# Patient Record
Sex: Male | Born: 1960 | Race: White | Hispanic: No | Marital: Married | Smoking: Former smoker
Health system: Southern US, Community
[De-identification: ages and names within clinical notes are randomized; demographics above are authoritative.]

---

## 2015-08-12 ENCOUNTER — Emergency Department (HOSPITAL_COMMUNITY): Payer: Managed Care, Other (non HMO)

## 2015-08-12 ENCOUNTER — Emergency Department (HOSPITAL_COMMUNITY)
Admission: EM | Admit: 2015-08-12 | Discharge: 2015-08-12 | Disposition: A | Discharge status: Home / Self Care | Payer: Managed Care, Other (non HMO) | Attending: Emergency Medicine | Admitting: Emergency Medicine

## 2015-08-12 ENCOUNTER — Encounter (HOSPITAL_COMMUNITY): Payer: Self-pay | Admitting: Emergency Medicine

## 2015-08-12 DIAGNOSIS — Z87891 Personal history of nicotine dependence: Secondary | ICD-10-CM | POA: Diagnosis not present

## 2015-08-12 DIAGNOSIS — M721 Knuckle pads: Secondary | ICD-10-CM | POA: Diagnosis not present

## 2015-08-12 DIAGNOSIS — M79641 Pain in right hand: Secondary | ICD-10-CM | POA: Diagnosis present

## 2015-08-12 DIAGNOSIS — M1990 Unspecified osteoarthritis, unspecified site: Secondary | ICD-10-CM | POA: Insufficient documentation

## 2015-08-12 NOTE — Discharge Instructions (Signed)

## 2015-08-12 NOTE — ED Provider Notes (Signed)
CSN: [id number]     Arrival date & time 08/12/15  3244 History  By signing my name below, I, Erik Farrell, attest that this documentation has been prepared under the direction and in the presence of Teressa Lower, NP Electronically Signed: Charline Bills, ED Scribe 08/12/2015 at 10:06 AM.   Chief Complaint  Patient presents with  . Hand Pain   The history is provided by the patient. No language interpreter was used.   HPI Comments: Erik Farrell is a 54 y.o. male who presents to the Emergency Department complaining of constant, 6/10 right hand pain over the past few months, worsened over the past few days. Pt driver trucks for a living and reports increased pain with switching gears. He also reports associated joint swelling and stiffness over the past few days. Pt denies injury. He has been receiving cortisone injections to the area from his PCP; last injection was 11/26/14. He contacted his PCP PTA but could not been seen until next week.   No past medical history on file. No past surgical history on file. No family history on file. Social History  Substance Use Topics  . Smoking status: Not on file  . Smokeless tobacco: Not on file  . Alcohol Use: Not on file    Review of Systems  Musculoskeletal: Positive for joint swelling and arthralgias.   Allergies  Review of patient's allergies indicates not on file.  Home Medications   Prior to Admission medications   Not on File   BP 168/103 mmHg  Temp(Src) 97.5 F (36.4 C) (Oral)  Resp 16  Ht  (1.854 m)  Wt 227 lb (102.967 kg)  BMI 29.96 kg/m2  SpO2 100% Physical Exam  Constitutional: He is oriented to person, place, and time. He appears well-developed and well-nourished. No distress.  HENT:  Head: Normocephalic and atraumatic.  Eyes: Conjunctivae and EOM are normal.  Neck: Neck supple. No tracheal deviation present.  Cardiovascular: Normal rate.   Pulmonary/Chest: Effort normal. No respiratory distress.   Musculoskeletal: Normal range of motion.  Swelling to the middle knuckle on the R hand. No redness or warmth noted. Full ROM.  Neurological: He is alert and oriented to person, place, and time.  Skin: Skin is warm and dry.  Psychiatric: He has a normal mood and affect. His behavior is normal.  Nursing note and vitals reviewed.  ED Course  Procedures (including critical care time) DIAGNOSTIC STUDIES: Oxygen Saturation is 100% on RA, normal by my interpretation.    COORDINATION OF CARE: 9:37 AM-Discussed treatment plan which includes XR with pt at bedside and pt agreed to plan.   Labs Review Labs Reviewed - No data to display  Imaging Review Dg Hand Complete Right  08/12/2015  CLINICAL DATA:  54 year old male with right hand pain for several months, increasing times days with joint swelling and stiffness. No known injury. Prior Cortisone injections into the hand most recently in April. Initial encounter. EXAM: RIGHT HAND - COMPLETE 3+ VIEW COMPARISON:  None. FINDINGS: Bone mineralization is within normal limits. Distal radius and ulna intact. Normal ulna styloid. Carpal bones appear intact with normal alignment and joint spaces. Metacarpals appear intact, but there is up to moderate third MCP joint space loss. Mild second and fourth MCP joint space loss. Mild associated osteophytosis. No periarticular erosions here. Bone mineralization appears within normal limits. Distal joint spaces remarkable for joint space loss with osteophytosis, most pronounced in the third finger with periarticular erosions at the third DIP (arrow). No fracture  dislocation. IMPRESSION: Arthropathic changes in the right hand most pronounced in the third ray. 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 No acute fracture or dislocation. Electronically Signed   By: Odessa FlemingH  Hall M.D.   On: 08/12/2015 10:02   I have personally reviewed and evaluated these images and lab results as part of my medical  decision-making.   EKG Interpretation None      MDM   Final diagnoses:  Arthritis    No acute injury noted. Pt given splint to help with the symptoms. Pt has sportsmedicine doctor in [place] to see  I personally performed the services described in this documentation, which was scribed in my presence. The recorded information has been reviewed and is accurate.     Teressa LowerVrinda Kadyn Guild, NP 08/12/15 1014  Loren Raceravid Yelverton, MD 08/12/15 [phone]

## 2015-08-12 NOTE — ED Notes (Signed)
Ortho tech called. Splint in stock is too small.

## 2017-01-30 IMAGING — DX DG HAND COMPLETE 3+V*R*
3 series · 3 of 3 positions shown · non-contrast
Comparison: None.

CLINICAL DATA: 54-year-old male with right hand pain for several
months, increasing times days with joint swelling and stiffness. No
known injury. Prior Cortisone injections into the hand most recently
in [place]. Initial encounter.

EXAM:
RIGHT HAND - COMPLETE 3+ VIEW

[x hand pa right]
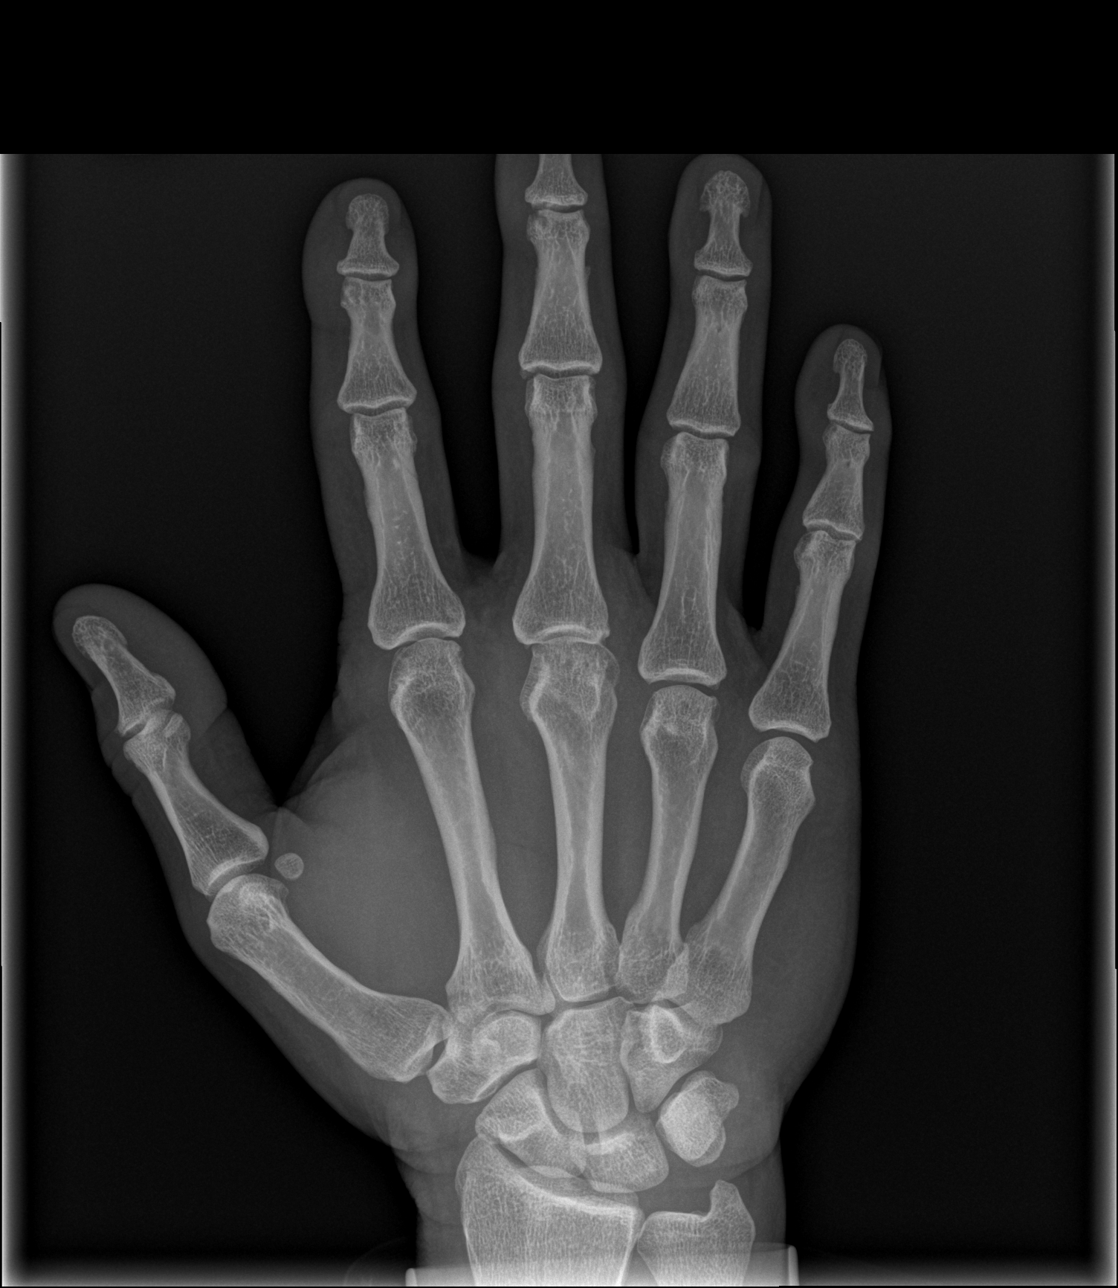

[x hand obl right]
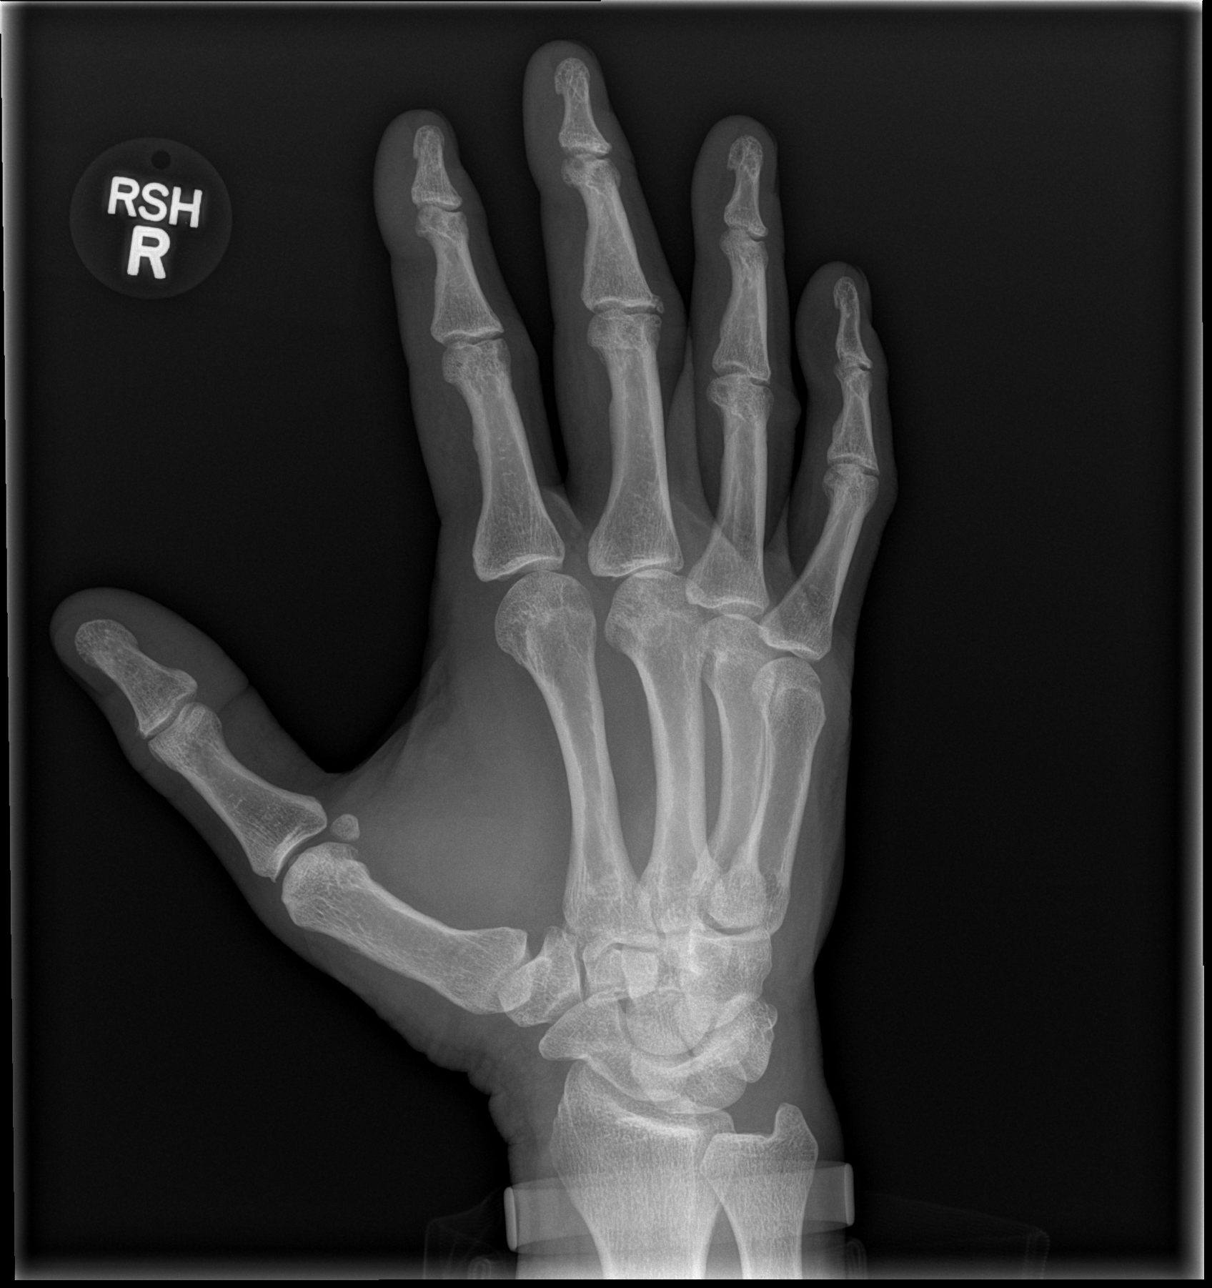

[x hand lat right]
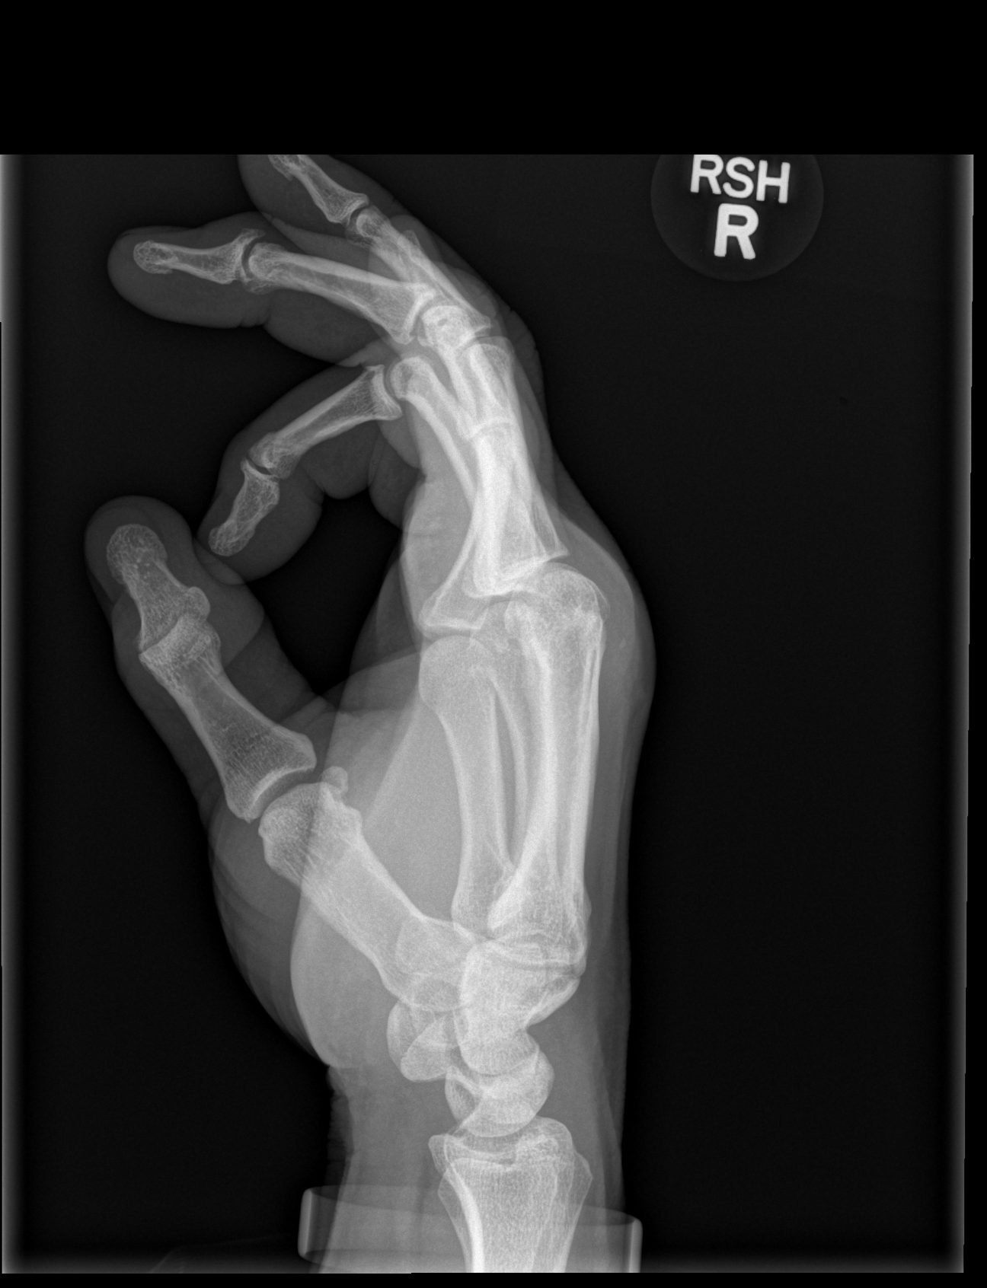

[3 of 3 positions shown; findings below may reference images not displayed]

FINDINGS: Bone mineralization is within normal limits. Distal radius and ulna
intact. Normal ulna styloid. Carpal bones appear intact with normal
alignment and joint spaces. Metacarpals appear intact, but there is
up to moderate third MCP joint space loss. Mild second and fourth
MCP joint space loss. Mild associated osteophytosis. No
periarticular erosions here. Bone mineralization appears within
normal limits. Distal joint spaces remarkable for joint space loss
with osteophytosis, most pronounced in the third finger with
periarticular erosions at the third DIP (arrow). No fracture
dislocation.
IMPRESSION: Arthropathic changes in the right hand most pronounced in the third
ray. Favor osteoarthritis with erosive OA at the third DIP. No acute
fracture or dislocation.

## 2019-12-05 ENCOUNTER — Ambulatory Visit: Payer: Managed Care, Other (non HMO) | Attending: Internal Medicine

## 2019-12-05 DIAGNOSIS — Z23 Encounter for immunization: Secondary | ICD-10-CM

## 2019-12-05 NOTE — Progress Notes (Signed)
   Covid-19 Vaccination Clinic  Name:  Erik Farrell    MRN: [id number] DOB: [birth year]  12/05/2019  Erik Farrell was observed post Covid-19 immunization for 15 minutes without incident. He was provided with Vaccine Information Sheet and instruction to access the V-Safe system.   Erik Farrell was instructed to call 911 with any severe reactions post vaccine: Marland Kitchen Difficulty breathing  . Swelling of face and throat  . A fast heartbeat  . A bad rash all over body  . Dizziness and weakness   Immunizations Administered    Name Date Dose VIS Date Route   Pfizer COVID-19 Vaccine 12/05/2019 11:40 AM 0.3 mL 08/07/2019 Intramuscular   Manufacturer: [employer], [employer]   Lot: [id number]   NDC: [address]-0

## 2019-12-28 ENCOUNTER — Ambulatory Visit: Payer: Managed Care, Other (non HMO) | Attending: Internal Medicine

## 2019-12-28 DIAGNOSIS — Z23 Encounter for immunization: Secondary | ICD-10-CM

## 2019-12-28 NOTE — Progress Notes (Signed)
   Covid-19 Vaccination Clinic  Name:  Jahking Lesser    MRN: [id number] DOB: 08/26/1961  12/28/2019  Mr. Storie was observed post Covid-19 immunization for 15 minutes without incident. He was provided with Vaccine Information Sheet and instruction to access the V-Safe system.   Mr. Harshberger was instructed to call 911 with any severe reactions post vaccine: Marland Kitchen Difficulty breathing  . Swelling of face and throat  . A fast heartbeat  . A bad rash all over body  . Dizziness and weakness   Immunizations Administered    Name Date Dose VIS Date Route   Pfizer COVID-19 Vaccine 12/28/2019 10:45 AM 0.3 mL 10/21/2018 Intramuscular   Manufacturer: [employer], [employer]   Lot: [id number]   NDC: [address]-3
